# Patient Record
Sex: Male | Born: 1975 | Race: White | Hispanic: No | Marital: Married | Smoking: Current every day smoker
Health system: Southern US, Community
[De-identification: ages and names within clinical notes are randomized; demographics above are authoritative.]

## PROBLEM LIST (undated history)

## (undated) DIAGNOSIS — E781 Pure hyperglyceridemia: Secondary | ICD-10-CM

## (undated) DIAGNOSIS — I700 Atherosclerosis of aorta: Secondary | ICD-10-CM

## (undated) DIAGNOSIS — I739 Peripheral vascular disease, unspecified: Secondary | ICD-10-CM

## (undated) DIAGNOSIS — I2510 Atherosclerotic heart disease of native coronary artery without angina pectoris: Secondary | ICD-10-CM

## (undated) DIAGNOSIS — E785 Hyperlipidemia, unspecified: Secondary | ICD-10-CM

## (undated) HISTORY — DX: Atherosclerotic heart disease of native coronary artery without angina pectoris: I25.10

## (undated) HISTORY — DX: Pure hyperglyceridemia: E78.1

## (undated) HISTORY — DX: Hyperlipidemia, unspecified: E78.5

## (undated) HISTORY — DX: Peripheral vascular disease, unspecified: I73.9

## (undated) HISTORY — DX: Atherosclerosis of aorta: I70.0

---

## 2009-03-01 ENCOUNTER — Ambulatory Visit: Payer: Self-pay | Admitting: Family Medicine

## 2009-03-01 DIAGNOSIS — L0231 Cutaneous abscess of buttock: Secondary | ICD-10-CM | POA: Insufficient documentation

## 2009-03-01 DIAGNOSIS — L03317 Cellulitis of buttock: Secondary | ICD-10-CM

## 2009-03-13 ENCOUNTER — Ambulatory Visit: Payer: Self-pay | Admitting: Family Medicine

## 2009-03-14 ENCOUNTER — Encounter: Payer: Self-pay | Admitting: Family Medicine

## 2009-03-14 ENCOUNTER — Telehealth (INDEPENDENT_AMBULATORY_CARE_PROVIDER_SITE_OTHER): Payer: Self-pay | Admitting: *Deleted

## 2009-05-28 ENCOUNTER — Ambulatory Visit: Payer: Self-pay | Admitting: Family Medicine

## 2009-05-28 DIAGNOSIS — E785 Hyperlipidemia, unspecified: Secondary | ICD-10-CM

## 2009-05-28 DIAGNOSIS — J019 Acute sinusitis, unspecified: Secondary | ICD-10-CM

## 2009-05-29 ENCOUNTER — Encounter: Payer: Self-pay | Admitting: Family Medicine

## 2009-05-30 LAB — CONVERTED CEMR LAB
AST: 13 units/L (ref 0–37)
Albumin: 4.1 g/dL (ref 3.5–5.2)
BUN: 15 mg/dL (ref 6–23)
CO2: 27 meq/L (ref 19–32)
Calcium: 9.3 mg/dL (ref 8.4–10.5)
Chloride: 104 meq/L (ref 96–112)
Cholesterol: 222 mg/dL — ABNORMAL HIGH (ref 0–200)
Glucose, Bld: 106 mg/dL — ABNORMAL HIGH (ref 70–99)
HDL: 32 mg/dL — ABNORMAL LOW (ref >39)
Potassium: 4.2 meq/L (ref 3.5–5.3)

## 2010-03-24 NOTE — Consult Note (Signed)
Summary: [facility]   Imported By: Lanelle Bal 04/11/2009 10:57:20  _____________________________________________________________________  External Attachment:    Type:   Image     Comment:   External Document

## 2010-03-24 NOTE — Assessment & Plan Note (Signed)
Summary: Painful urination, ANal pain x 4 dys rm 3   Vital Signs:  Patient Profile:   35 Years Old Male CC:      Anal pain, frequent, urinating,  x 4 dys Height:     74 inches Weight:      250 pounds O2 Sat:      100 % O2 treatment:    Room Air Temp:     97.2 degrees F oral Pulse rate:   93 / minute Pulse rhythm:   irregular Resp:     16 per minute BP sitting:   135 / 85  (right arm) Cuff size:   regular  Vitals Entered By: Areta Haber CMA (March 01, 2009 11:05 AM)                  Current Allergies: No known allergies History of Present Illness Chief Complaint: Anal pain, frequent, urinating,  x 4 dys History of Present Illness: Subjective:  Patient complains of right sided anal pain for about 4 to 5 days.  He has pain wihen sitting, and pain when attempting to have a bowel movement.  He has been constipated since the pain began.  He states that he had chills last night.  No abdominal pain.  No GU symptoms  He visited his PCP initially who diagnosed possible hemorrhoids.  He has not had improvement from Anusol [facility] suppositories.  Current Problems: CELLULITIS AND ABSCESS OF BUTTOCK (ICD-682.5)   Current Meds ANUSOL-HC 25 MG SUPP (HYDROCORTISONE ACETATE) 1 supp q 6 hrs VICODIN 5-500 MG TABS (HYDROCODONE-ACETAMINOPHEN) as directed  for pain PERI-COLACE 8.6-50 MG TABS (SENNOSIDES-DOCUSATE SODIUM) As directed LEVAQUIN 750 MG TABS (LEVOFLOXACIN) One by mouth once daily for 7 days METRONIDAZOLE 500 MG TABS (METRONIDAZOLE) One by mouth q6hr  REVIEW OF SYSTEMS Constitutional Symptoms      Denies fever, chills, night sweats, weight loss, weight gain, and fatigue.  Eyes       Denies change in vision, eye pain, eye discharge, glasses, contact lenses, and eye surgery. Ear/Nose/Throat/Mouth       Denies hearing loss/aids, change in hearing, ear pain, ear discharge, dizziness, frequent runny nose, frequent nose bleeds, sinus problems, sore throat, hoarseness, and tooth pain  or bleeding.  Respiratory       Denies dry cough, productive cough, wheezing, shortness of breath, asthma, bronchitis, and emphysema/COPD.  Cardiovascular       Denies murmurs, chest pain, and tires easily with exhertion.    Gastrointestinal       Complains of constipation.      Denies stomach pain, nausea/vomiting, diarrhea, blood in bowel movements, and indigestion. Genitourniary       Complains of painful urination.      Denies kidney stones and loss of urinary control.      Comments: x 4dys Neurological       Denies paralysis, seizures, and fainting/blackouts. Musculoskeletal       Denies muscle pain, joint pain, joint stiffness, decreased range of motion, redness, swelling, muscle weakness, and gout.  Skin       Denies bruising, unusual mles/lumps or sores, and hair/skin or nail changes.  Psych       Denies mood changes, temper/anger issues, anxiety/stress, speech problems, depression, and sleep problems. Other Comments: Pt seen PCP on Wed prescribed Anusol and Vicodin. Pt states that he's still in pain and now having painful urination. Pt has not followed up with PCP.   Past History:  Past Medical History: Unremarkable  Past Surgical  History: Denies surgical history  Social History: Married Current Smoker - 1 1/1 pack daily Alcohol use-no Drug use-no Regular exercise-no Smoking Status:  current Drug Use:  no Does Patient Exercise:  no   Objective:  Patient reluctant to sit, but in no acute distress  Mouth:  moist mucous membranes  Neck:  Supple.  No adenopathy is present.  Lungs:  Clear to auscultation.  Breath sounds are equal.  Heart:  Regular rate and rhythm without murmurs, rubs, or gallops.  Abdomen:  Nontender without masses or hepatosplenomegaly.  Bowel sounds are present.  No CVA or flank tenderness.  Rectal Exam:  Anus is normal but there is erythema and tenderness of the adjacent right buttock at the intergluteal cleft.  No swelling, fluctuance, or  warmth observed.   No external hemorrhoids are present.  Unable to perform digital rectal exam because of patient's discomfort   Assessment New Problems: CELLULITIS AND ABSCESS OF BUTTOCK (ICD-682.5)  CELLULITIS OF RIGHT PERI-RECTAL AREA, POSSIBLE EARLY ABSCESS  Plan New Medications/Changes: METRONIDAZOLE 500 MG TABS (METRONIDAZOLE) One by mouth q6hr  #28 x 0, 03/01/2009, Donna Christen MD LEVAQUIN 750 MG TABS (LEVOFLOXACIN) One by mouth once daily for 7 days  #5 x 0, 03/01/2009, Donna Christen MD  New Orders: Rocephin  250mg  [Y3016] Admin of Therapeutic Inj  intramuscular or subcutaneous [96372] New Patient Level III [01093] Planning Comments:   Rocephin 1 gm given IM.  Begin Levaquin and metronidazole.  May continue Vicodin previously prescribed. Continue warm sitz baths.  Discontinue the Anusol suppositories.  Obtain soft "doughtnut" pad for sitting. If not improved in 2 days, or if increased pain/swelling/fever develop, follow-up with general surgeon for I and D    Diagnoses and expected course of recovery discussed and will return if not improved as expected or if the condition worsens. Patient and/or caregiver verbalized understanding.  Prescriptions: METRONIDAZOLE 500 MG TABS (METRONIDAZOLE) One by mouth q6hr  #28 x 0   Entered and Authorized by:   Donna Christen MD   Signed by:   Donna Christen MD on 03/01/2009   Method used:   Print then Give to Patient   RxID:   [id number] LEVAQUIN 750 MG TABS (LEVOFLOXACIN) One by mouth once daily for 7 days  #5 x 0   Entered and Authorized by:   Donna Christen MD   Signed by:   Donna Christen MD on 03/01/2009   Method used:   Print then Give to Patient   RxID:   [id number]    Medication Administration  Injection # 1:    Medication: Rocephin  250mg     Diagnosis: CELLULITIS AND ABSCESS OF BUTTOCK (ICD-682.5)    Route: IM    Site: LUOQ gluteus    Exp Date: 05/23/2011    Lot #: [id number]    Mfr: Sandox    Comments:  Administered 1gm    Patient tolerated injection without complications    Given by: Areta Haber CMA (March 01, 2009 12:38 PM)

## 2010-03-24 NOTE — Assessment & Plan Note (Signed)
Summary: Sinusitis, lipids   Vital Signs:  Patient profile:   35 year old male Height:      73 inches Weight:      251 pounds Temp:     98.2 degrees F oral Pulse rate:   71 / minute BP sitting:   130 / 77  (left arm) Cuff size:   large  Vitals Entered By: Kathlene November (May 28, 2009 8:04 AM) CC: mostly head congestion in the mornings, non productive cough for over 1 week   Primary Care Provider:  Nani Gasser, MD  CC:  mostly head congestion in the mornings and non productive cough for over 1 week.  History of Present Illness: mostly head congestion in the mornings, non productive cough for over 1 week. Yesterday was worse. coupleof days of fever.  Earache on the left. Taking benadryl and a cough syrups.  No SOB or wheezing.  NO Gi sxs. Mild ST.  nasal congestion in the morning. Does have allergies this stime of year.   Current Medications (verified): 1)  None  Allergies (verified): No Known Drug Allergies  Comments:  Nurse/Medical Assistant: The patient's medications and allergies were reviewed with the patient and were updated in the Medication and Allergy Lists. Kathlene November (May 28, 2009 8:05 AM)  Physical Exam  General:  Well-developed,well-nourished,in no acute distress; alert,appropriate and cooperative throughout examination Head:  Normocephalic and atraumatic without obvious abnormalities. No apparent alopecia or balding. Eyes:  No corneal or conjunctival inflammation noted. EOMI. Perrla. Ears:  External ear exam shows no significant lesions or deformities.  Otoscopic examination reveals clear canals, tympanic membranes are intact bilaterally without bulging, retraction, inflammation or discharge. Hearing is grossly normal bilaterally. Nose:  External nasal examination shows no deformity or inflammation. Nasal mucosa are pink and moist without lesions or exudates. Trubinates are midly swollen.  Mouth:  Oral mucosa and oropharynx without lesions or exudates.   Teeth in good repair. Neck:  No deformities, masses, or tenderness noted. Lungs:  Normal respiratory effort, chest expands symmetrically. Lungs are clear to auscultation, no crackles or wheezes. Heart:  Normal rate and regular rhythm. S1 and S2 normal without gallop, murmur, click, rub or other extra sounds. Skin:  no rashes.   Cervical Nodes:  No lymphadenopathy noted Psych:  Cognition and judgment appear intact. Alert and cooperative with normal attention span and concentration. No apparent delusions, illusions, hallucinations   Impression & Recommendations:  Problem # 1:  SINUSITIS - ACUTE-NOS (ICD-461.9)  His updated medication list for this problem includes:    Amoxicillin 875 Mg Tabs (Amoxicillin) .Marland Kitchen... Take 1 tablet by mouth two times a day for 10 days  Instructed on treatment. Call if symptoms persist or worsen. Also recommend to treat his allergies with either claritin or zyrtec that will last 24 hours instead of the benadryl.  Call if not better in one week.   Problem # 2:  HYPERLIPIDEMIA (ICD-272.4) Has been off meds for over 1 year. Will recheck his baseline and see if needs to restart meds.  Orders: T-Lipid Profile ([id number]) T-Comprehensive Metabolic Panel [id number])  Complete Medication List: 1)  Amoxicillin 875 Mg Tabs (Amoxicillin) .... Take 1 tablet by mouth two times a day for 10 days  Patient Instructions: 1)  Complete all of your antibiotics.  2)  We will call you with your lab results 3)  REcommend start either a claritin or a zyrtec. Prescriptions: AMOXICILLIN 875 MG TABS (AMOXICILLIN) Take 1 tablet by mouth two times a day  for 10 days  #20 x 0   Entered and Authorized by:   Nani Gasser MD   Signed by:   Nani Gasser MD on 05/28/2009   Method used:   Electronically to        [facility] [address] (retail)       [address]       [place], [place]  [address]       Ph: [phone]       Fax: [phone]   RxID:   [id number]

## 2010-03-24 NOTE — Progress Notes (Signed)
----   Converted from flag ---- ---- 03/14/2009 12:51 PM, Kathlene November wrote: Yes I scheduled him urgently yesterday while in the office. No need to schedule  ---- 03/14/2009 12:40 PM, Darral Dash wrote:     [name] , is this pt scheduled  I called him he said he has appt  today  Let me know if I need to get him appt. ------------------------------

## 2010-03-24 NOTE — Assessment & Plan Note (Signed)
Summary: NOV: F/u from UC for perirectal abscess   Vital Signs:  Patient profile:   35 year old male Height:      73 inches Weight:      250 pounds BMI:     33.10 Pulse rate:   76 / minute BP sitting:   120 / 72  (left arm) Cuff size:   large  Vitals Entered By: Kathlene November (March 13, 2009 3:31 PM) CC: followup peri-rectal abcess- still sore- finished antibiotics 3-4 days ago Is Patient Diabetic? No   Primary Care Provider:  Nani Gasser, MD  CC:  followup peri-rectal abcess- still sore- finished antibiotics 3-4 days ago.  History of Present Illness: Next day the area drained a like a thick white cream. Drained for a couple of days. Also some noticed some blood.  Does feel better. Can actually feels better. Still a little sore. Completed both ABX. No fever. Never happned before. No fever. He is a Hospital doctor. hx of pilonidal cyst, but denies any pain or tendernss or drainage from that area.  pain is mild right now and he is not actively taking anything for pain.   Habits & Providers  Alcohol-Tobacco-Diet     Alcohol drinks/day: 0     Tobacco Status: current     Cigarette Packs/Day: 1.5     Year Started: 20 years ago  Exercise-Depression-Behavior     Does Patient Exercise: no     Have you felt down or hopeless? no     STD Risk: never     Drug Use: never     Seat Belt Use: always  Current Medications (verified): 1)  None  Allergies (verified): No Known Drug Allergies  Comments:  Nurse/Medical Assistant: The patient's medications and allergies were reviewed with the patient and were updated in the Medication and Allergy Lists. Kathlene November (March 13, 2009 3:32 PM)  Past History:  Past Medical History: Last updated: 03/01/2009 Unremarkable  Past Surgical History: Last updated: 03/01/2009 Denies surgical history  Family History: Father with alcoholism, uncle with colon CA GF with MI  Social History: Married to Yaak. One daughter.  Driver for  [employer].   Current Smoker - 1 1/1 pack daily Alcohol use-no Drug use-no Regular exercise-no 4-5 caffeinated drinks per day.  Packs/Day:  1.5 STD Risk:  never Drug Use:  never Seat Belt Use:  always  Review of Systems       No fever/sweats/weakness, unexplained weight loss/gain.  No vison changes.  No difficulty hearing/ringing in ears, hay fever/allergies.  No chest pain/discomfort, palpitations.  No Br lump/nipple discharge.  No cough/wheeze.  No blood in BM, nausea/vomiting/diarrhea.  No nighttime urination, leaking urine, unusual vaginal bleeding, discharge (penis or vagina).  No muscle/joint pain. No rash, change in mole.  No HA, memory loss.  No anxiety, sleep d/o, depression.  No easy bruising/bleeding, unexplained lump   Physical Exam  General:  Well-developed,well-nourished,in no acute distress; alert,appropriate and cooperative throughout examination Rectal:  no external abnormalities, no hemorrhoids, and normal sphincter tone.  Has a 1 cm nodule under the skin at the 9 'clock positon (rightbuttock side) at the edge of the rectum that is very tender. He also has at least 3 dimples along the sacram indicative of a pilonidal cystic tract, thought this does not appear to be inflammed or infected and has no drainage.  Skin:  no rashes.   Psych:  Cognition and judgment appear intact. Alert and cooperative with normal attention span and concentration. No apparent delusions, illusions,  hallucinations   Impression & Recommendations:  Problem # 1:  CELLULITIS AND ABSCESS OF BUTTOCK (ICD-682.5) Because he still has a tender nodule even after spontaneous drainage I recommended be seen by gen surgery for possible I & D. They wre able to get him in tomorrow so will hold off on refilling the ABX.  Continue with warm sitz bath adn IBU or Tyelnol for pain relief. pain is mild right now and he is not actively taking anything for pain.  The following medications were removed from the medication  list:    Levaquin 750 Mg Tabs (Levofloxacin) ..... One by mouth once daily for 7 days    Metronidazole 500 Mg Tabs (Metronidazole) ..... One by mouth q6hr  Orders: Surgical Referral (Surgery)  Appended Document: NOV: F/u from UC for perirectal abscess  TD Result Date:  02/22/2002 TD Result:  given TD Next Due:  10 yr

## 2010-03-24 NOTE — Letter (Signed)
Summary: Out of Work  [facility]  [address]   [place], [place] [address]   Phone: [phone]  Fax: [phone]    March 01, 2009   Employee:  Gregory Burke    To Whom It May Concern:   Gregory Burke was evaluated in our clinic today.  For medical reasons he should remain out of work on 03/03/2009.  If you need additional information, please feel free to contact our office.         Sincerely,    Donna Christen MD

## 2010-05-11 ENCOUNTER — Encounter: Payer: Self-pay | Admitting: Family Medicine

## 2010-05-21 NOTE — Letter (Signed)
Summary: External Correspondence  External Correspondence   Imported By: Dannette Barbara 05/11/2010 10:01:42  _____________________________________________________________________  External Attachment:    Type:   Image     Comment:   External Document

## 2020-10-13 ENCOUNTER — Other Ambulatory Visit: Payer: Self-pay

## 2020-10-13 ENCOUNTER — Ambulatory Visit: Payer: BC Managed Care – PPO | Admitting: Cardiology

## 2020-10-13 ENCOUNTER — Encounter: Payer: Self-pay | Admitting: Cardiology

## 2020-10-13 VITALS — BP 147/81 | HR 71 | Temp 98.0°F | Resp 16 | Ht 74.0 in | Wt 272.0 lb

## 2020-10-13 DIAGNOSIS — R072 Precordial pain: Secondary | ICD-10-CM

## 2020-10-13 DIAGNOSIS — E781 Pure hyperglyceridemia: Secondary | ICD-10-CM

## 2020-10-13 DIAGNOSIS — E7800 Pure hypercholesterolemia, unspecified: Secondary | ICD-10-CM

## 2020-10-13 DIAGNOSIS — I739 Peripheral vascular disease, unspecified: Secondary | ICD-10-CM

## 2020-10-13 DIAGNOSIS — F17200 Nicotine dependence, unspecified, uncomplicated: Secondary | ICD-10-CM

## 2020-10-13 MED ORDER — METOPROLOL TARTRATE 25 MG PO TABS: 25.0000 mg | ORAL_TABLET | Freq: Two times a day (BID) | ORAL | 0 refills | Status: DC

## 2020-10-13 NOTE — Progress Notes (Signed)
Date:  10/13/2020   ID:  [name], DOB [birth year], MRN [id number]  PCP:  Roderick Pee, PA  Cardiologist:  Tessa Lerner, DO, [facility] (established care 10/13/2020)  REASON FOR CONSULT: Chest pain  REQUESTING PHYSICIAN:  Roderick Pee, PA [address] [place] [address]  Chief Complaint  Patient presents with   Chest Pain   New Patient (Initial Visit)    HPI  Gregory Burke is a 45 y.o. male who presents to the office with a chief complaint of " chest pain and bilateral lower extremity pain." Patient's past medical history and cardiovascular risk factors include: Hyperlipidemia, hypertriglyceridemia, active smoking 2 packs/day,  He is referred to the office at the request of Gregory Burke, * for evaluation of chest pain.  Chest pain: Patient stated the symptoms started approximately 1.5 months ago and initially was getting progressively worse up until 2 weeks ago when he started to baby aspirin 81 mg p.o. daily his symptoms have significantly subsided.  About 2 weeks ago he had his worst chest discomfort while he was participating in corn hole tournament when he was experiencing left-sided chest discomfort, sharp like sensation, 8 out of 10 in intensity, started to slow down his physical activity and took a quick break and his symptoms subsided in 15 minutes.  He followed up with his PCP was started on aspirin 81 mg p.o. daily and referred to cardiology for further evaluation and management.  Other factors to consider is he has been smoking since the age of 1 and consumes about 2 packs/day.  He also has pure hypercholesterolemia for which she was started on rosuvastatin and his LDL has improved.  Review of systems are also positive for claudication.  Patient states that with activity he has bilateral hip and thigh pain which have been going on for the last 5 months.  He has not had any syncopal events.  But states that at times his legs stay cold.  No  infection in the legs or open ulcers that he is aware of.  Patient is a truck driver but tries to ambulate about 7-10,000 steps on a daily basis if possible.  No family history of premature coronary disease or sudden cardiac death.   FUNCTIONAL STATUS: No organized physical activity but tries to ambulate 7,000-10,000 steps a day.  ALLERGIES: Not on File  MEDICATION LIST PRIOR TO VISIT: Current Meds  Medication Sig   aspirin EC 81 MG tablet Take 81 mg by mouth daily. Swallow whole.   metoprolol tartrate (LOPRESSOR) 25 MG tablet Take 1 tablet (25 mg total) by mouth 2 (two) times daily. Hold if systolic blood pressure (top number) less than 100 mmHg or pulse less than 60 bpm.   rosuvastatin (CRESTOR) 20 MG tablet Take 20 mg by mouth at bedtime.     PAST MEDICAL HISTORY: Past Medical History:  Diagnosis Date   Hyperlipidemia    Hypertriglyceridemia     PAST SURGICAL HISTORY: History reviewed. No pertinent surgical history.  FAMILY HISTORY: The patient family history includes Cancer in his father.  SOCIAL HISTORY:  The patient  reports that he has been smoking cigarettes. He has a 60.00 pack-year smoking history. He has never used smokeless tobacco. He reports that he does not drink alcohol and does not use drugs.  REVIEW OF SYSTEMS: Review of Systems  Constitutional: Negative for chills and fever.  HENT:  Negative for hoarse voice and nosebleeds.   Eyes:  Negative for discharge,  double vision and pain.  Cardiovascular:  Positive for chest pain and claudication. Negative for dyspnea on exertion, leg swelling, near-syncope, orthopnea, palpitations, paroxysmal nocturnal dyspnea and syncope.  Respiratory:  Positive for shortness of breath. Negative for hemoptysis.   Musculoskeletal:  Negative for muscle cramps and myalgias.  Gastrointestinal:  Negative for abdominal pain, constipation, diarrhea, hematemesis, hematochezia, melena, nausea and vomiting.  Neurological:  Negative for  dizziness and light-headedness.   PHYSICAL EXAM: Vitals with BMI 10/13/2020 05/28/2009 03/13/2009  Height 6\' 2"  6\' 1"  6\' 1"   Weight 272 lbs 251 lbs 250 lbs  BMI 34.91 33.2 33.1  Systolic 147 130  Diastolic 81 77 72  Pulse 71 71 76    CONSTITUTIONAL: Well-developed and well-nourished. No acute distress.  SKIN: Skin is warm and dry. No rash noted. No cyanosis. No pallor. No jaundice HEAD: Normocephalic and atraumatic.  EYES: No scleral icterus.  Xanthelasmas bilateral upper eyelids MOUTH/THROAT: Moist oral membranes.  NECK: No JVD present. No thyromegaly noted. No carotid bruits  LYMPHATIC: No visible cervical adenopathy.  CHEST Normal respiratory effort. No intercostal retractions  LUNGS: Clear to auscultation bilaterally.  No stridor. No wheezes. No rales.  CARDIOVASCULAR: Regular rate and rhythm, positive S1-S2, no murmurs rubs or gallops appreciated ABDOMINAL: Soft, nontender, nondistended, positive bowel sounds in all 4 quadrants, no apparent ascites.  EXTREMITIES: No peripheral edema.  Left lower extremity 2+ DP and PT pulses, right lower extremity 1+ DP and PT pulse, no open wounds, diminished hair distribution in the lower extremities from mid shin distally. HEMATOLOGIC: No significant bruising NEUROLOGIC: Oriented to person, place, and time. Nonfocal. Normal muscle tone.  PSYCHIATRIC: Normal mood and affect. Normal behavior. Cooperative  CARDIAC DATABASE: EKG: 10/13/2020: Normal sinus rhythm, 65 bpm, poor R wave progression, without underlying injury pattern.  Echocardiogram: No results found for this or any previous visit from the past 1095 days.   Stress Testing: No results found for this or any previous visit from the past 1095 days.  Heart Catheterization: None  LABORATORY DATA: No flowsheet data found.  CMP Latest Ref Rng & Units 05/29/2009  Glucose 70 - 99 mg/dL 191)  BUN 6 - 23 mg/dL 15  Creatinine 10/15/2020 - 07/29/2009 mg/dL 478(G  Sodium 9.56 - 2.13 meq/L 141   Potassium 3.5 - 5.3 meq/L 4.2  Chloride 96 - 112 meq/L 104  CO2 19 - 32 meq/L 27  Calcium 8.4 - 10.5 mg/dL 9.3  Total Protein 6.0 - 8.3 g/dL 6.8  Total Bilirubin 0.3 - 1.2 mg/dL 0.3  Alkaline Phos 39 - 117 units/L 73  AST 0 - 37 units/L 13  ALT 0 - 53 units/L 12    Lipid Panel     Component Value Date/Time   CHOL 222 (H) 05/29/2009 1932   TRIG 109 05/29/2009 1932   HDL 32 (L) 05/29/2009 1932   CHOLHDL 6.9 Ratio 05/29/2009 1932   VLDL 22 05/29/2009 1932   LDLCALC 168 (H) 05/29/2009 1932    No components found for: NTPROBNP No results for input(s): PROBNP in the last 8760 hours. No results for input(s): TSH in the last 8760 hours.  BMP No results for input(s): NA, K, CL, CO2, GLUCOSE, BUN, CREATININE, CALCIUM, GFRNONAA, GFRAA in the last 8760 hours.  HEMOGLOBIN A1C No results found for: HGBA1C, MPG  External Labs: Collected: 08/11/2020 available in Care Everywhere Lipid profile: Total cholesterol 262, triglycerides 300, HDL 34, LDL 168, non-HDL 228 Sodium 138, potassium 4, chloride 103, bicarb 29, BUN 16, creatinine 0.94 AST  17, ALT 16 TSH 2.65 Hemoglobin 15.1 g/dL, hematocrit 16.1%44.4%  0/96/04548/12/2020 available in Care Everywhere Total cholesterol 131, triglycerides 176, HDL 32, LDL 73, non-HDL 99  IMPRESSION:    ICD-10-CM   1. Precordial pain  R07.2 EKG 12-Lead    CT CORONARY MORPH W/CTA COR W/SCORE W/CA W/CM &/OR WO/CM    Basic metabolic panel    PCV ECHOCARDIOGRAM COMPLETE    metoprolol tartrate (LOPRESSOR) 25 MG tablet    2. Claudication (HCC)  I73.9 PCV LOWER ARTERIAL (BILATERAL)    PCV AORTA DUPLEX    3. Pure hypercholesterolemia  E78.00     4. Hypertriglyceridemia  E78.1     5. Smoking  F17.200        RECOMMENDATIONS: Ceasar MonsJames J Burke is a 45 y.o. male whose past medical history and cardiac risk factors include: Hyperlipidemia, hypertriglyceridemia, active smoking 2 packs/day.  Precordial pain Suggestive of cardiac etiology. Echocardiogram will be ordered  to evaluate for structural heart disease and left ventricular systolic function. Coronary CTA plus or minus FFR to evaluate for obstructive CAD Continue aspirin and statin therapy. Start Lopressor 25 mg p.o. twice daily Strongly encouraged complete smoking cessation. Encouraged him not to overexert than his regular activity. If he has worsening chest pain that increases in intensity, frequency, duration or has typical angina pectoris as discussed at today's visit he is strongly encouraged to go to the hospital via EMS for further evaluation and management.  Pure hypercholesterolemia Xanthelasmas noted bilaterally involving upper eyelids. 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 Continue rosuvastatin for now. Currently managed by primary care provider.  Hypertriglyceridemia Educated on decreasing his carbohydrate intake. Recommend rechecking fasting lipid profile in 6 weeks to reevaluate therapy.  Will defer to primary team at this time.  Claudication [facility](HCC) Will check bilateral lower extremity ankle-brachial index and arterial duplex of the aorta and bilateral lower extremities. Patient has at least 68-year pack history of smoking.  Cigarette smoking: Educated on the importance of complete smoking cessation. Patient has been smoking since the age of 45 approximately 2 packs/day equaling up to at least 68-year pack history of smoking Tobacco cessation counseling: Currently smoking 2 packs/day   Patient was informed of the dangers of tobacco abuse including stroke, cancer, and MI, as well as benefits of tobacco cessation. Patient is not willing to quit at this time. Approximately 7 mins were spent counseling patient cessation techniques. We discussed various methods to help quit smoking, including deciding on a date to quit, joining a support group, pharmacological agents- nicotine gum/patch/lozenges.  I will  reassess his progress at the next follow-up visit   FINAL MEDICATION LIST END OF ENCOUNTER: Meds ordered this encounter  Medications   metoprolol tartrate (LOPRESSOR) 25 MG tablet    Sig: Take 1 tablet (25 mg total) by mouth 2 (two) times daily. Hold if systolic blood pressure (top number) less than 100 mmHg or pulse less than 60 bpm.    Dispense:  60 tablet    Refill:  0     There are no discontinued medications.   Current Outpatient Medications:    aspirin EC 81 MG tablet, Take 81 mg by mouth daily. Swallow whole., Disp: , Rfl:    metoprolol tartrate (LOPRESSOR) 25 MG tablet, Take 1 tablet (25 mg total) by mouth 2 (two) times daily. Hold if systolic blood pressure (top number) less than 100 mmHg or pulse less than 60 bpm., Disp: 60 tablet, Rfl: 0   rosuvastatin (  CRESTOR) 20 MG tablet, Take 20 mg by mouth at bedtime., Disp: , Rfl:   Orders Placed This Encounter  Procedures   CT CORONARY MORPH W/CTA COR W/SCORE W/CA W/CM &/OR WO/CM   Basic metabolic panel   EKG 12-Lead   PCV ECHOCARDIOGRAM COMPLETE   PCV LOWER ARTERIAL (BILATERAL)   PCV AORTA DUPLEX    There are no Patient Instructions on file for this visit.   --Continue cardiac medications as reconciled in final medication list. --Return in about 3 weeks (around 11/03/2020) for Follow up, Chest pain, Review test results. Or sooner if needed. --Continue follow-up with your primary care physician regarding the management of your other chronic comorbid conditions.  Patient's questions and concerns were addressed to his satisfaction. He voices understanding of the instructions provided during this encounter.   This note was created using a voice recognition software as a result there may be grammatical errors inadvertently enclosed that do not reflect the nature of this encounter. Every attempt is made to correct such errors.  Tessa Lerner, [place], [facility]  Pager: [phone] Office: [phone]

## 2020-10-16 LAB — BASIC METABOLIC PANEL
BUN/Creatinine Ratio: 10 (ref 9–20)
BUN: 10 mg/dL (ref 6–24)
CO2: 25 mmol/L (ref 20–29)
Calcium: 9.5 mg/dL (ref 8.7–10.2)
Chloride: 102 mmol/L (ref 96–106)
Creatinine, Ser: 0.96 mg/dL (ref 0.76–1.27)
Glucose: 92 mg/dL (ref 65–99)
Potassium: 4.2 mmol/L (ref 3.5–5.2)
Sodium: 141 mmol/L (ref 134–144)
eGFR: 99 mL/min/{1.73_m2} (ref >59)

## 2020-10-17 ENCOUNTER — Other Ambulatory Visit (HOSPITAL_COMMUNITY): Payer: Self-pay | Admitting: Cardiology

## 2020-10-17 ENCOUNTER — Ambulatory Visit (HOSPITAL_COMMUNITY)
Admission: RE | Admit: 2020-10-17 | Discharge: 2020-10-17 | Disposition: A | Discharge status: Home / Self Care | Payer: BC Managed Care – PPO | Source: Ambulatory Visit | Attending: Cardiology | Admitting: Cardiology

## 2020-10-17 ENCOUNTER — Other Ambulatory Visit: Payer: Self-pay

## 2020-10-17 ENCOUNTER — Encounter (HOSPITAL_COMMUNITY): Payer: Self-pay

## 2020-10-17 DIAGNOSIS — R079 Chest pain, unspecified: Secondary | ICD-10-CM

## 2020-10-17 DIAGNOSIS — R072 Precordial pain: Secondary | ICD-10-CM | POA: Diagnosis not present

## 2020-10-17 DIAGNOSIS — Z006 Encounter for examination for normal comparison and control in clinical research program: Secondary | ICD-10-CM

## 2020-10-17 MED ORDER — NITROGLYCERIN 0.4 MG SL SUBL
0.8000 mg | SUBLINGUAL_TABLET | Freq: Once | SUBLINGUAL | Status: AC
  Administered 2020-10-17: 0.8 mg via SUBLINGUAL

## 2020-10-17 MED ORDER — NITROGLYCERIN 0.4 MG SL SUBL
SUBLINGUAL_TABLET | SUBLINGUAL | Status: AC
  Filled 2020-10-17: qty 2

## 2020-10-17 MED ORDER — IOHEXOL 350 MG/ML SOLN
80.0000 mL | Freq: Once | INTRAVENOUS | Status: AC | PRN
  Administered 2020-10-17: 80 mL via INTRAVENOUS

## 2020-10-17 NOTE — Research (Signed)
IDENTIFY Informed Consent                  Subject Name: Gregory Burke    Subject met inclusion and exclusion criteria.  The informed consent form, study requirements and expectations were reviewed with the subject and questions and concerns were addressed prior to the signing of the consent form.  The subject verbalized understanding of the trial requirements.  The subject agreed to participate in the IDENTIFY trial and signed the informed consent at 15:40pm on 10/17/20.  The informed consent was obtained prior to performance of any protocol-specific procedures for the subject.  A copy of the signed informed consent was given to the subject and a copy was placed in the subject's medical record.    Ledon Snare , Research Assistant

## 2020-10-17 NOTE — Progress Notes (Signed)
CT scan completed. Tolerated well. D/C home ambulatory with wife, awake and alert. In no distress 

## 2020-10-21 ENCOUNTER — Telehealth: Payer: Self-pay

## 2020-10-21 NOTE — Telephone Encounter (Signed)
Patient called requesting his CT results, and to ask if he needs to continue his Metoprolol. Please advise.

## 2020-10-21 NOTE — Telephone Encounter (Signed)
Patient will continue Metoprolol and is aware that we will give him a call back shortly with his CT results.

## 2020-10-21 NOTE — Telephone Encounter (Signed)
If it has helped his CP continue metoprolol.  Will get him the CCTA results to him shortly.

## 2020-10-24 ENCOUNTER — Ambulatory Visit (HOSPITAL_COMMUNITY)
Admission: RE | Admit: 2020-10-24 | Discharge: 2020-10-24 | Disposition: A | Discharge status: Home / Self Care | Payer: BC Managed Care – PPO | Source: Ambulatory Visit | Attending: Cardiology | Admitting: Cardiology

## 2020-10-24 ENCOUNTER — Other Ambulatory Visit: Payer: Self-pay | Admitting: Cardiology

## 2020-10-24 DIAGNOSIS — R931 Abnormal findings on diagnostic imaging of heart and coronary circulation: Secondary | ICD-10-CM | POA: Insufficient documentation

## 2020-10-24 DIAGNOSIS — R079 Chest pain, unspecified: Secondary | ICD-10-CM | POA: Diagnosis present

## 2020-11-05 ENCOUNTER — Other Ambulatory Visit: Payer: Self-pay

## 2020-11-05 ENCOUNTER — Ambulatory Visit: Payer: BC Managed Care – PPO

## 2020-11-05 DIAGNOSIS — I739 Peripheral vascular disease, unspecified: Secondary | ICD-10-CM

## 2020-11-05 DIAGNOSIS — R072 Precordial pain: Secondary | ICD-10-CM

## 2020-11-06 ENCOUNTER — Other Ambulatory Visit: Payer: Self-pay | Admitting: Cardiology

## 2020-11-06 DIAGNOSIS — R072 Precordial pain: Secondary | ICD-10-CM

## 2020-11-08 ENCOUNTER — Other Ambulatory Visit: Payer: Self-pay | Admitting: Cardiology

## 2020-11-08 DIAGNOSIS — R072 Precordial pain: Secondary | ICD-10-CM

## 2020-11-13 ENCOUNTER — Ambulatory Visit: Payer: BC Managed Care – PPO | Admitting: Cardiology

## 2020-11-20 ENCOUNTER — Ambulatory Visit: Payer: BC Managed Care – PPO | Admitting: Cardiology

## 2020-11-20 ENCOUNTER — Encounter: Payer: Self-pay | Admitting: Cardiology

## 2020-11-20 ENCOUNTER — Other Ambulatory Visit: Payer: Self-pay

## 2020-11-20 VITALS — BP 129/78 | HR 56 | Temp 97.2°F | Resp 16 | Ht 74.0 in | Wt 271.0 lb

## 2020-11-20 DIAGNOSIS — F17200 Nicotine dependence, unspecified, uncomplicated: Secondary | ICD-10-CM

## 2020-11-20 DIAGNOSIS — E7800 Pure hypercholesterolemia, unspecified: Secondary | ICD-10-CM

## 2020-11-20 DIAGNOSIS — I2510 Atherosclerotic heart disease of native coronary artery without angina pectoris: Secondary | ICD-10-CM

## 2020-11-20 DIAGNOSIS — I700 Atherosclerosis of aorta: Secondary | ICD-10-CM

## 2020-11-20 DIAGNOSIS — E781 Pure hyperglyceridemia: Secondary | ICD-10-CM

## 2020-11-20 DIAGNOSIS — I739 Peripheral vascular disease, unspecified: Secondary | ICD-10-CM

## 2020-11-20 NOTE — Progress Notes (Signed)
Date:  11/20/2020   ID:  Gregory Burke, DOB 04-Apr-1975, MRN [id number]  PCP:  Roderick Pee, PA  Cardiologist:  Tessa Lerner, DO, [facility] (established care 10/13/2020)  Date: 11/20/20 Last Office Visit: 10/13/2020  Chief Complaint  Patient presents with   Chest Pain   Results   Follow-up    HPI  Gregory Burke is a 45 y.o. male who presents to the office with a chief complaint of " reevaluation of chest pain and discuss test results." Patient's past medical history and cardiovascular risk factors include: mild coronary artery calcification, non-obstructive CAD per CCTA, Mild PAD (abnormal ABI 10/2020), abdominal aortic atherosclerosis, hyperlipidemia, hypertriglyceridemia, active smoking.  He is referred to the office at the request of Roderick Pee, PA for evaluation of chest pain.  During initial consultation patient's symptoms of precordial pain were very suggestive of cardiac etiology and given multiple cardiovascular risk factors including approximately 68-year pack history of smoking, uncontrolled hyperlipidemia and ankle with phenotypic presentation of xanthelasmas that shared decision was to proceed with ischemic evaluation.  Reviewed the results of the echocardiogram, coronary CTA with FFR results with him to during today's encounter and noted below for further reference.  Clinically patient states that he has not experienced chest pain since last office visit.  Claudication: Patient complained of bilateral hip and thigh pain ongoing for the last 5 months.  Denies any symptoms of critical limb ischemia.  Given his extensive history of smoking and hyperlipidemia he underwent ankle-brachial index which is suggestive of decreased perfusion bilaterally; however, lower extremity arterial duplex does not show any hemodynamically significant stenosis.  Prior to the last office visit he was smoking 2 packs/day and now is down to half a pack per day with nicotine patches to supplement his  efforts of smoking cessation.  Patient is a truck driver but tries to ambulate about 7-10,000 steps on a daily basis if possible.  No family history of premature coronary disease or sudden cardiac death.  FUNCTIONAL STATUS: No organized physical activity but tries to ambulate 7,000-10,000 steps a day.  ALLERGIES: No Known Allergies  MEDICATION LIST PRIOR TO VISIT: Current Meds  Medication Sig   aspirin EC 81 MG tablet Take 81 mg by mouth daily. Swallow whole.   nicotine (NICODERM CQ - DOSED IN MG/24 HOURS) 21 mg/24hr patch 21 mg daily.   rosuvastatin (CRESTOR) 20 MG tablet Take 20 mg by mouth at bedtime.   [DISCONTINUED] metoprolol tartrate (LOPRESSOR) 25 MG tablet TAKE 1 TABLET BY MOUTH 2 TIMES DAILY. HOLD IF SYSTOLIC BLOOD PRESSURE (TOP NUMBER) LESS THAN 100 MMHG OR PULSE LESS THAN 60 BPM.     PAST MEDICAL HISTORY: Past Medical History:  Diagnosis Date   Abdominal aortic atherosclerosis (HCC)    Abdominal aortic duplex 10/2020   Coronary atherosclerosis due to calcified coronary lesion    Hyperlipidemia    Hypertriglyceridemia    PAD (peripheral artery disease) (HCC)    Abnormal ABI 10/2020    PAST SURGICAL HISTORY: History reviewed. No pertinent surgical history.  FAMILY HISTORY: The patient family history includes Cancer in his father.  SOCIAL HISTORY:  The patient  reports that he has been smoking cigarettes. He has a 60.00 pack-year smoking history. He has never used smokeless tobacco. He reports that he does not drink alcohol and does not use drugs.  REVIEW OF SYSTEMS: Review of Systems  Constitutional: Negative for chills and fever.  HENT:  Negative for hoarse voice and nosebleeds.   Eyes:  Negative for discharge, double vision and pain.  Cardiovascular:  Positive for claudication (stable). Negative for chest pain, dyspnea on exertion, leg swelling, near-syncope, orthopnea, palpitations, paroxysmal nocturnal dyspnea and syncope.  Respiratory:  Negative for  hemoptysis and shortness of breath.   Musculoskeletal:  Negative for muscle cramps and myalgias.  Gastrointestinal:  Negative for abdominal pain, constipation, diarrhea, hematemesis, hematochezia, melena, nausea and vomiting.  Neurological:  Negative for dizziness and light-headedness.   PHYSICAL EXAM: Vitals with BMI 11/20/2020 10/17/2020 10/17/2020  Height 6\' 2"  - -  Weight 271 lbs - -  BMI 34.78 - -  Systolic 129 106  Diastolic 78 57 73  Pulse 56 - -    CONSTITUTIONAL: Well-developed and well-nourished. No acute distress.  SKIN: Skin is warm and dry. No rash noted. No cyanosis. No pallor. No jaundice HEAD: Normocephalic and atraumatic.  EYES: No scleral icterus.  Xanthelasmas bilateral upper eyelids MOUTH/THROAT: Moist oral membranes.  NECK: No JVD present. No thyromegaly noted. No carotid bruits  LYMPHATIC: No visible cervical adenopathy.  CHEST Normal respiratory effort. No intercostal retractions  LUNGS: Clear to auscultation bilaterally.  No stridor. No wheezes. No rales.  CARDIOVASCULAR: Regular rate and rhythm, positive S1-S2, no murmurs rubs or gallops appreciated ABDOMINAL: Soft, nontender, nondistended, positive bowel sounds in all 4 quadrants, no apparent ascites.  EXTREMITIES: No peripheral edema.  Left lower extremity 2+ DP and PT pulses, right lower extremity 1+ DP and PT pulse, no open wounds, diminished hair distribution in the lower extremities from mid shin distally. HEMATOLOGIC: No significant bruising NEUROLOGIC: Oriented to person, place, and time. Nonfocal. Normal muscle tone.  PSYCHIATRIC: Normal mood and affect. Normal behavior. Cooperative  CARDIAC DATABASE: EKG: 10/13/2020: Normal sinus rhythm, 65 bpm, poor R wave progression, without underlying injury pattern.  Echocardiogram: 11/05/2020: Normal LV systolic function with visual EF 50-55%. Left ventricle cavity is normal in size. Mild left ventricular hypertrophy. Normal global wall motion. Normal  diastolic filling pattern, normal LAP.  Trace tricuspid regurgitation. No evidence of pulmonary hypertension. Mildly dilated proximal ascending aorta 19mm. No prior study for comparison.   Stress Testing: No results found for this or any previous visit from the past 1095 days.  Heart Catheterization: None  CCTA 10/17/2020: 1. Total coronary calcium score of 47. This was 89th percentile for age and sex matched control. 2. Normal coronary origin with co-dominance. 3. CAD-RADS = 3.  Minimal stenosis (1-24%) at proximal/mid LAD after the first diagonal branch due to mixed plaque.  Moderate stenosis (50-69%) closer to 50% lesion at mid LAD due to calcified plaque. 4. Aortic atherosclerosis. 5. Study is sent for CT-FFR to further evaluate the mid LAD. Findings will be performed and reported separately. 6. Negative over-read examination. No acute abnormality involving the extracardiac structures.  CT FFR 10/24/2020: 1. CT FFR analysis showed no significant stenosis.  Lower Extremity Arterial Duplex 11/05/2020: No hemodynamically significant stenoses are identified in the right lower extremity arterial system. No hemodynamically significant stenoses are identified in the left lower extremity arterial system. This exam reveals mildly decreased perfusion of the right lower extremity, noted at the post tibial artery level (ABI 0.92) and moderately decreased perfusion of the left lower extremity, noted at the post tibial artery level (ABI 0.79) with mildly abnormal biphasic waveform at the level of the ankle. Study suggests mild small vessel disease.   Abdominal Aortic Duplex 11/05/2020: The maximum aorta (sac) diameter is 2.03 cm (dist). No AAA.  Mild diffuse atherosclerotic plaque noted in the abdominal aorta.  Normal flow velocities noted in the abdominal aorta and bilateral internal iliac arteries.   LABORATORY DATA: No flowsheet data found.  CMP Latest Ref Rng & Units 10/15/2020 05/29/2009   Glucose 65 - 99 mg/dL 92 408(X)  BUN 6 - 24 mg/dL 10 15  Creatinine 4.48 - 1.27 mg/dL 1.85 6.31  Sodium 497 - 144 mmol/L 141 141  Potassium 3.5 - 5.2 mmol/L 4.2 4.2  Chloride 96 - 106 mmol/L 102 104  CO2 20 - 29 mmol/L 25 27  Calcium 8.7 - 10.2 mg/dL 9.5 9.3  Total Protein 6.0 - 8.3 g/dL - 6.8  Total Bilirubin 0.3 - 1.2 mg/dL - 0.3  Alkaline Phos 39 - 117 units/L - 73  AST 0 - 37 units/L - 13  ALT 0 - 53 units/L - 12    Lipid Panel     Component Value Date/Time   CHOL 222 (H) 05/29/2009 1932   TRIG 109 05/29/2009 1932   HDL 32 (L) 05/29/2009 1932   CHOLHDL 6.9 Ratio 05/29/2009 1932   VLDL 22 05/29/2009 1932   LDLCALC 168 (H) 05/29/2009 1932    No components found for: NTPROBNP No results for input(s): PROBNP in the last 8760 hours. No results for input(s): TSH in the last 8760 hours.  BMP Recent Labs    10/15/20 1608  NA 141  K 4.2  CL 102  CO2 25  GLUCOSE 92  BUN 10  CREATININE 0.96  CALCIUM 9.5    HEMOGLOBIN A1C No results found for: HGBA1C, MPG  External Labs: Collected: 08/11/2020 available in Care Everywhere Lipid profile: Total cholesterol 262, triglycerides 300, HDL 34, LDL 168, non-HDL 228 Sodium 138, potassium 4, chloride 103, bicarb 29, BUN 16, creatinine 0.94 AST 17, ALT 16 TSH 2.65 Hemoglobin 15.1 g/dL, hematocrit 02.6%  3/78/5885 available in Care Everywhere Total cholesterol 131, triglycerides 176, HDL 32, LDL 73, non-HDL 99  IMPRESSION:    ICD-10-CM   1. Coronary atherosclerosis due to calcified coronary lesion  I25.10    I25.84     2. Nonobstructive atherosclerosis of coronary artery  I25.10     3. PAD (peripheral artery disease) (HCC) - abnormal ABI  I73.9     4. Claudication (HCC)  I73.9     5. Abdominal aortic atherosclerosis (HCC)  I70.0     6. Pure hypercholesterolemia  E78.00     7. Hypertriglyceridemia  E78.1     8. Smoking  F17.200        RECOMMENDATIONS: Gregory Burke is a 45 y.o. male whose past medical history  and cardiac risk factors include: mild coronary artery calcification, non-obstructive CAD per CCTA, Mild PAD (abnormal ABI 10/2020), Hyperlipidemia, hypertriglyceridemia, active smoking 2 packs/day, abdominal aortic atherosclerosis.  Coronary atherosclerosis due to calcified coronary lesion Total CAC 47, 89th percentile Nonobstructive CAD per coronary CTA and no hemodynamically significant stenosis per CT FFR.  Images independently reviewed with the patient during today's encounter. Continue aspirin and statin therapy. Educated on importance of complete smoking cessation. Educated on importance of secondary prevention.  Nonobstructive atherosclerosis of coronary artery See above  PAD (peripheral artery disease) (HCC) - abnormal ABI Clinically has symptoms of claudication. Work-up noted bilateral abnormal ABI suggestive of reduced perfusion; however, arterial duplex does not illustrate hemodynamically significant stenosis. Educated in importance of complete smoking cessation Aspirin and statin therapy. Increase physical activity as tolerated with a goal of 30 minutes a day 5 days a week. Monitor for now.  Abdominal aortic atherosclerosis (HCC) Continue aspirin and statin  therapy  Pure hypercholesterolemia Currently on Crestor.   He denies myalgia or other side effects. Most recent lipids dated 10/02/2020 reviewed as noted above. Currently managed by primary care provider.  Hypertriglyceridemia Improving. Educated the importance of lifestyle modification with his diet and increasing physical activity as tolerated with a goal of 30 minutes a day 5 days a week Currently managed by PCP.  Smoking Tobacco cessation counseling: Currently smoking <0.5 packs/day   Patient is willing to quit at this time. Patient was smoking 2 packs/day at last office visit and now is down to less than 0.5 packs/day.  Is currently wearing nicotine patches.  Patient is congratulated on his efforts. 5 mins  were spent counseling patient cessation techniques. We discussed various methods to help quit smoking, including deciding on a date to quit, joining a support group, pharmacological agents- nicotine gum/patch/lozenges,  I will reassess his progress at the next follow-up visit  I would like to see him on an annual basis with regards to reevaluating his symptoms, addressing his modifiable cardiovascular risk factors, I have asked him to bring in his most recent lab work from his PCP for reference and review.  FINAL MEDICATION LIST END OF ENCOUNTER: No orders of the defined types were placed in this encounter.    Medications Discontinued During This Encounter  Medication Reason   metoprolol tartrate (LOPRESSOR) 25 MG tablet      Current Outpatient Medications:    aspirin EC 81 MG tablet, Take 81 mg by mouth daily. Swallow whole., Disp: , Rfl:    nicotine (NICODERM CQ - DOSED IN MG/24 HOURS) 21 mg/24hr patch, 21 mg daily., Disp: , Rfl:    rosuvastatin (CRESTOR) 20 MG tablet, Take 20 mg by mouth at bedtime., Disp: , Rfl:   No orders of the defined types were placed in this encounter.   There are no Patient Instructions on file for this visit.   --Continue cardiac medications as reconciled in final medication list. --Return in about 1 year (around 11/20/2021) for Follow up, Coronary artery calcification, CAD, hyperlipidemia. . Or sooner if needed. --Continue follow-up with your primary care physician regarding the management of your other chronic comorbid conditions.  Patient's questions and concerns were addressed to his satisfaction. He voices understanding of the instructions provided during this encounter.   This note was created using a voice recognition software as a result there may be grammatical errors inadvertently enclosed that do not reflect the nature of this encounter. Every attempt is made to correct such errors.  Tessa Lerner, [place], [facility]  Pager: [phone] Office:  [phone]

## 2021-10-25 IMAGING — CT CT HEART MORP W/ CTA COR W/ SCORE W/ CA W/CM &/OR W/O CM
2 of 5 series · 11 of 20 positions shown, 13 images · IV contrast (APPLIED)
Comparison: None.
COMPARISON: None.

Addendum:
EXAM:
OVER-READ INTERPRETATION  CT CHEST

The following report is an over-read performed by radiologist Dr.
Muaviya Mantri [REDACTED] on 10/17/2020. This over-read
does not include interpretation of cardiac or coronary anatomy or
pathology. The coronary calcium score/coronary CTA interpretation by
the cardiologist is attached.
HISTORY: Che Wah Billie is a 44 y.o. male whose past medical history and
cardiac risk factors include: Hyperlipidemia, hypertriglyceridemia,
active smoking 2 packs/day presents with chest pain.
Cardiac/Coronary  CT
TECHNIQUE: The patient was scanned on a Siemens Force scanner.
PROTOCOL: A 120 kV prospective scan was triggered in the descending thoracic
aorta at 111 HU's. Axial non-contrast 3 mm slices were carried out
through the heart. The data set was analyzed on a dedicated work
station and scored using the Agatson method. Gantry rotation speed
was 250 msecs and collimation was .6 mm. No IV beta blockade but
mg of sl NTG was given. The 3D data set was reconstructed in 5%
intervals of the 67-82 % of the R-R cycle. Diastolic phases were
analyzed on a dedicated work station using MPR, MIP and VRT modes.
The patient received 80mL OMNIPAQUE IOHEXOL 350 MG/ML SOLN of
contrast.

[Series 6: best diast 73 % · axial · 0.40mm/px · z∈[-344,-257]mm · 6 of 306 slices shown]
[im 44/306  vessel]
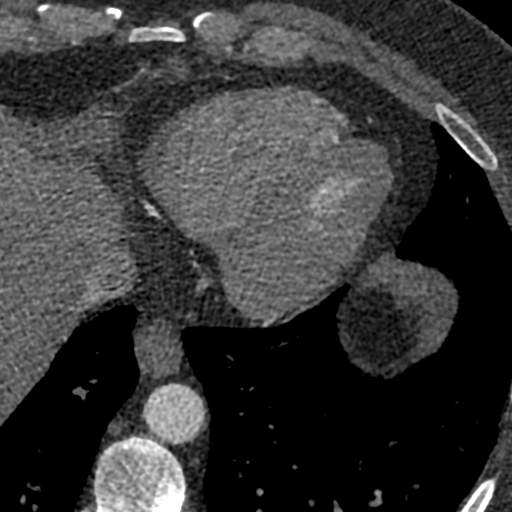
[im 88/306  vessel]
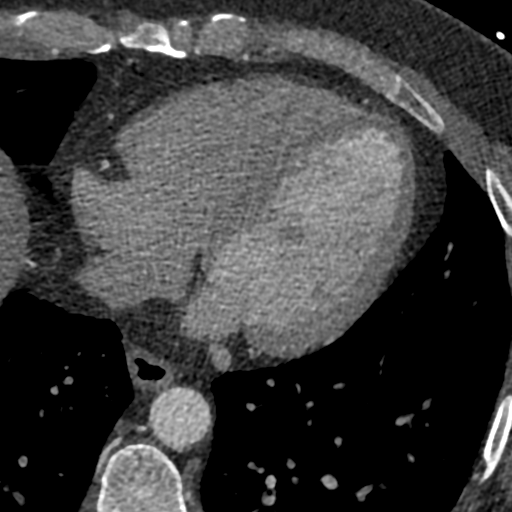
[im 131/306  vessel]
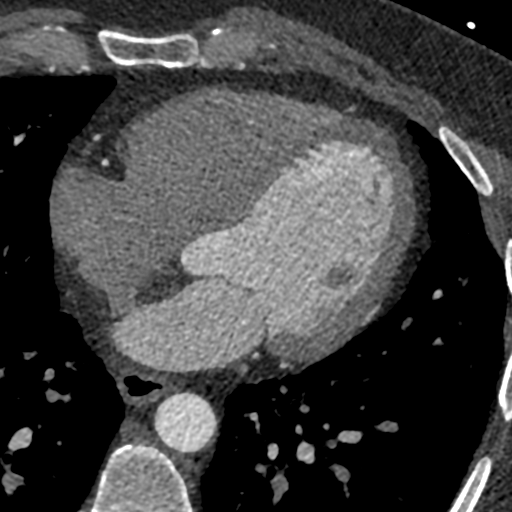
[im 175/306  vessel]
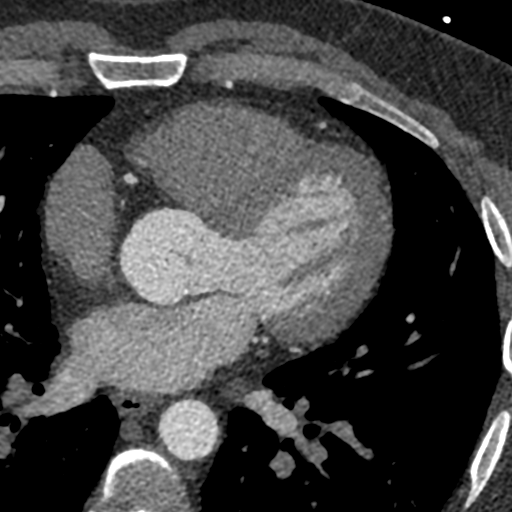
[im 218/306  vessel]
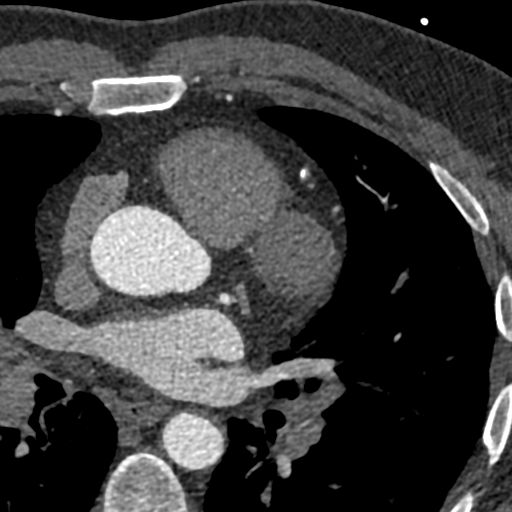
[im 262/306  vessel]
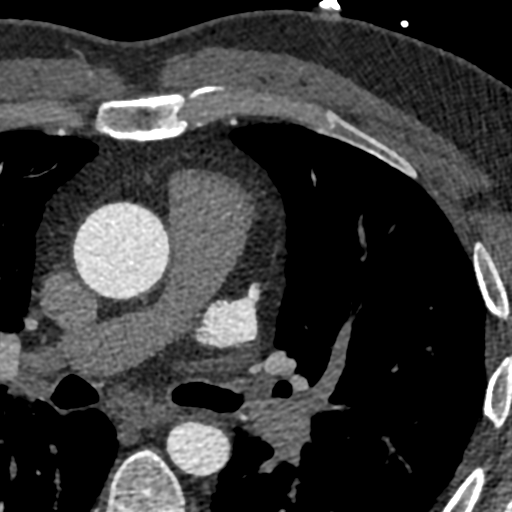

[Series 8: ts syst sharp · axial · 0.39mm/px · z∈[-342,-260]mm · 5 of 306 slices shown, 7 images]
[im 51/306  vessel]
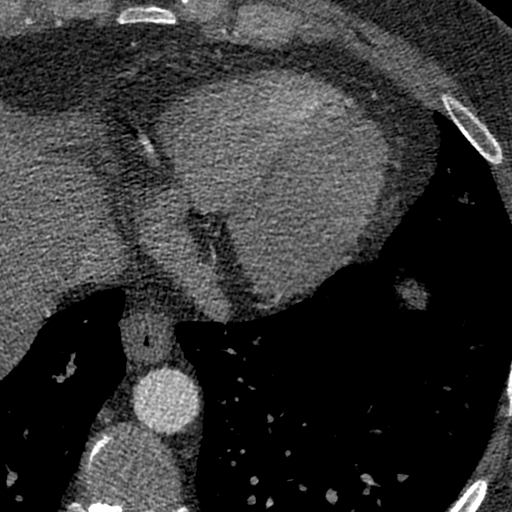
[im 51/306  lung]
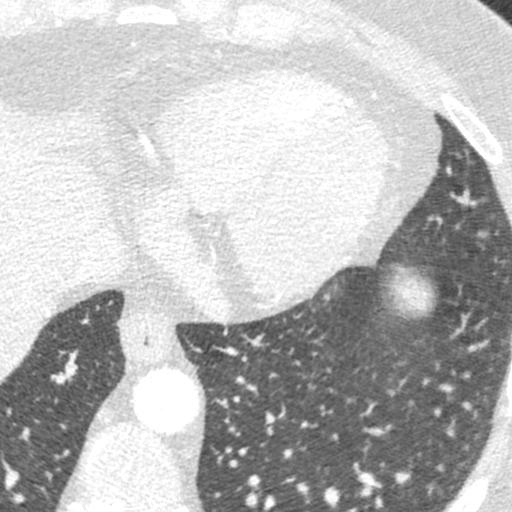
[im 102/306  vessel]
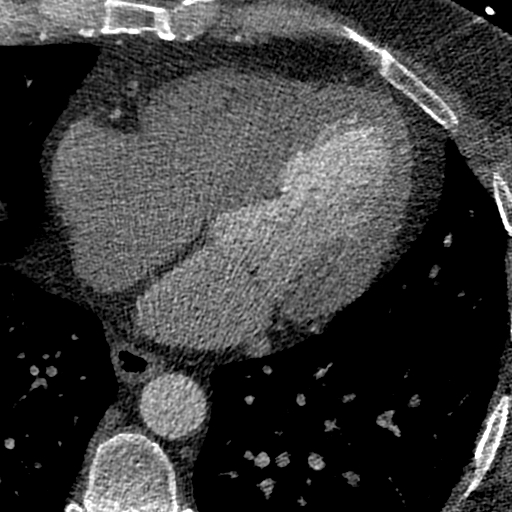
[im 153/306  vessel]
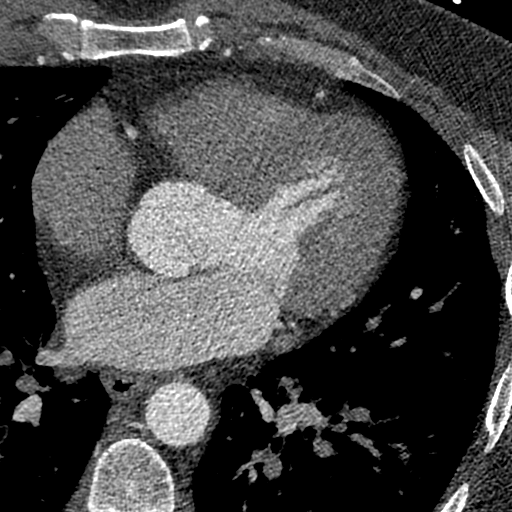
[im 204/306  vessel]
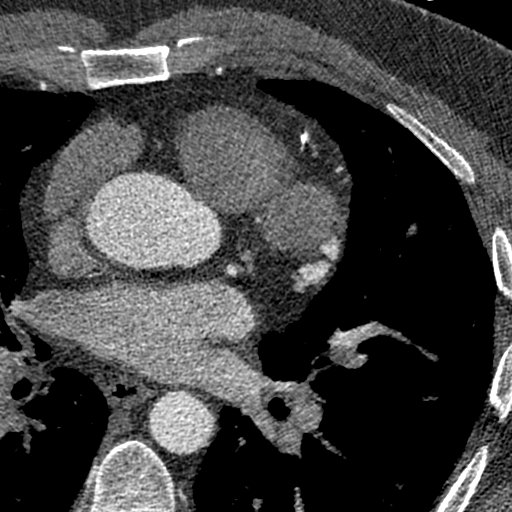
[im 255/306  vessel]
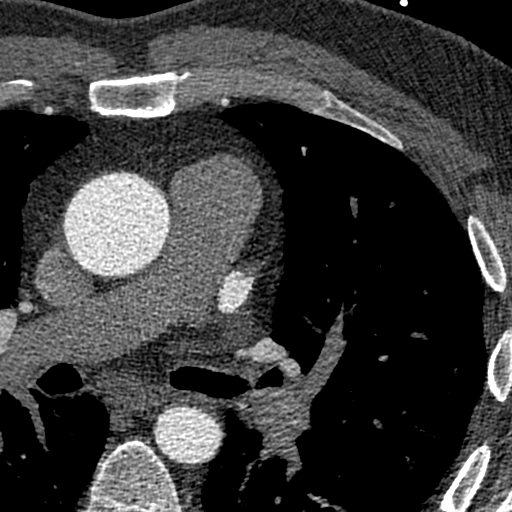
[im 255/306  lung]
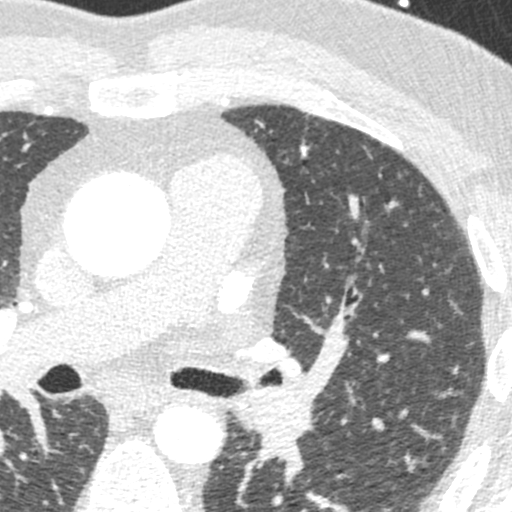

[11 of 20 positions shown; findings below may reference images not displayed]

FINDINGS: Vascular: Normal caliber of the visualized thoracic aorta. Pulmonary
arteries are not opacified with contrast.

Mediastinum/Nodes: Visualized mediastinal structures are normal.

Lungs/Pleura: Visualized lungs are clear.

Upper Abdomen: Images of the upper abdomen are unremarkable.

Musculoskeletal: No acute bone abnormality.
IMPRESSION: Negative over-read examination. No acute abnormality involving the
extracardiac structures.
FINDINGS: Image quality: Average.

Artifact: Moderate (motion and respiratory).

Coronary artery calcification score:

Left main: 0

Left anterior descending artery: 47

Left circumflex artery: 0

Right coronary artery: 0

Total coronary calcium score of 47, places the patient at the 89th
percentile for age and sex matched control.

Coronary arteries: Normal coronary origins.  Co-dominance.

Left Main Coronary Artery: The left main is a normal caliber vessel
with a normal take off from the left coronary cusp that bifurcates
to form a left anterior descending artery and a left circumflex
artery. There is no plaque or stenosis.

Left Anterior Descending Coronary Artery: Normal caliber vessel,
reaches the apex, gives off 2 patent diagonal branches. Minimal
stenosis (1-24%) at proximal/mid LAD after the first diagonal branch
due to mixed plaque. Moderate stenosis (50-69%) closer to 50% lesion
at mid LAD due to calcified plaque. Distal / apical LAD is patent.

The LAD is patent without evidence of plaque or stenosis.

Left Circumflex Artery: Normal caliber vessel, co-dominant, travels
within the atrioventricular groove, gives off 4 patent obtuse
marginal branches. The LCX is patent with no evidence of plaque or
stenosis.

Right Coronary Artery: The RCA is co-dominant with normal take off
from the right coronary cusp. There is no evidence of plaque or
stenosis.

Left Atrium: Grossly normal in size with no left atrial appendage
filling defect.

Left Ventricle: Grossly normal in size. There are no stigmata of
prior infarction. There is no abnormal filling defect.

Pulmonary arteries: Normal in size without proximal filling defect.

Pulmonary veins: Normal pulmonary venous drainage.

Aorta: Normal size, 37 mm at the mid ascending aorta (level of the
PA bifurcation) measured double oblique. No calcifications. No
dissection.

Pericardium: Normal thickness with no significant effusion or
calcium present.

Cardiac valves: The aortic valve is trileaflet without significant
calcification. The mitral valve is normal structure without
significant calcification.

Extra-cardiac findings: See attached radiology report for
non-cardiac structures.
IMPRESSION: 1. Total coronary calcium score of 47. This was 89th percentile for
age and sex matched control.

2. Normal coronary origin with co-dominance.

3. CAD-RADS = 3. Minimal stenosis (1-24%) at proximal/mid LAD after
the first diagonal branch due to mixed plaque. Moderate stenosis
(50-69%) closer to 50% lesion at mid LAD due to calcified plaque.

4. Aortic atherosclerosis.

4. Study is sent for CT-FFR to further evaluate the mid LAD.
Findings will be performed and reported separately.

RECOMMENDATIONS:

Consider symptom-guided anti-ischemic pharmacotherapy as well as
risk factor modification per guideline directed care. Additional
analysis with CT FFR will be submitted.

*** End of Addendum ***
EXAM:
OVER-READ INTERPRETATION  CT CHEST

The following report is an over-read performed by radiologist Dr.
Muaviya Mantri [REDACTED] on 10/17/2020. This over-read
does not include interpretation of cardiac or coronary anatomy or
pathology. The coronary calcium score/coronary CTA interpretation by
the cardiologist is attached.
FINDINGS: Vascular: Normal caliber of the visualized thoracic aorta. Pulmonary
arteries are not opacified with contrast.

Mediastinum/Nodes: Visualized mediastinal structures are normal.

Lungs/Pleura: Visualized lungs are clear.

Upper Abdomen: Images of the upper abdomen are unremarkable.

Musculoskeletal: No acute bone abnormality.
IMPRESSION: Negative over-read examination. No acute abnormality involving the
extracardiac structures.

## 2021-11-20 ENCOUNTER — Ambulatory Visit: Payer: BC Managed Care – PPO | Admitting: Cardiology
